# Patient Record
Sex: Male | Born: 1965 | Race: White | Hispanic: No | Marital: Single | Smoking: Never smoker
Health system: Southern US, Community
[De-identification: ages and names within clinical notes are randomized; demographics above are authoritative.]

## PROBLEM LIST (undated history)

## (undated) HISTORY — PX: CHOLECYSTECTOMY: SHX55

---

## 2014-01-27 ENCOUNTER — Emergency Department (HOSPITAL_BASED_OUTPATIENT_CLINIC_OR_DEPARTMENT_OTHER)
Admission: EM | Admit: 2014-01-27 | Discharge: 2014-01-28 | Disposition: A | Discharge status: Home / Self Care | Payer: Worker's Compensation | Attending: Emergency Medicine | Admitting: Emergency Medicine

## 2014-01-27 ENCOUNTER — Encounter (HOSPITAL_BASED_OUTPATIENT_CLINIC_OR_DEPARTMENT_OTHER): Payer: Self-pay | Admitting: Emergency Medicine

## 2014-01-27 ENCOUNTER — Emergency Department (HOSPITAL_BASED_OUTPATIENT_CLINIC_OR_DEPARTMENT_OTHER): Payer: Self-pay | Attending: Emergency Medicine

## 2014-01-27 DIAGNOSIS — Y9389 Activity, other specified: Secondary | ICD-10-CM | POA: Insufficient documentation

## 2014-01-27 DIAGNOSIS — W268XXA Contact with other sharp object(s), not elsewhere classified, initial encounter: Secondary | ICD-10-CM | POA: Insufficient documentation

## 2014-01-27 DIAGNOSIS — Z23 Encounter for immunization: Secondary | ICD-10-CM | POA: Insufficient documentation

## 2014-01-27 DIAGNOSIS — S61214A Laceration without foreign body of right ring finger without damage to nail, initial encounter: Secondary | ICD-10-CM

## 2014-01-27 DIAGNOSIS — Y9289 Other specified places as the place of occurrence of the external cause: Secondary | ICD-10-CM | POA: Insufficient documentation

## 2014-01-27 DIAGNOSIS — Y990 Civilian activity done for income or pay: Secondary | ICD-10-CM | POA: Insufficient documentation

## 2014-01-27 DIAGNOSIS — S61209A Unspecified open wound of unspecified finger without damage to nail, initial encounter: Secondary | ICD-10-CM | POA: Insufficient documentation

## 2014-01-27 NOTE — ED Notes (Signed)
Finger injury at work. Got his ring caught on a piece of metal. Laceration to his right 3rd and 4th digits. Bleeding controlled.

## 2014-01-28 MED ORDER — TETANUS-DIPHTH-ACELL PERTUSSIS 5-2.5-18.5 LF-MCG/0.5 IM SUSP
0.5000 mL | Freq: Once | INTRAMUSCULAR | Status: AC
  Administered 2014-01-28: 0.5 mL via INTRAMUSCULAR
  Filled 2014-01-28: qty 0.5

## 2014-01-28 NOTE — ED Provider Notes (Signed)
CSN: [id number]     Arrival date & time 01/27/14  2139 History   First MD Initiated Contact with Patient 01/28/14 0016     Chief Complaint  Patient presents with  . Finger Injury      Patient is a [age] y.o. male presenting with skin laceration. The history is provided by the patient.  Laceration Location:  Finger Finger laceration location:  R ring finger Length (cm):  2 Quality: jagged   Time since incident: just prior to arrival. Pain details:    Quality:  Aching   Severity:  Moderate   Timing:  Constant   Progression:  Unchanged Relieved by:  None tried Worsened by:  Nothing tried Tetanus status:  Unknown pt got his finger caught on piece of metal at work No crush injury reported Reports pain in right ring finger but no other complaints  PMH - none Past Surgical History  Procedure Laterality Date  . Cholecystectomy     No family history on file. History  Substance Use Topics  . Smoking status: Never Smoker   . Smokeless tobacco: Not on file  . Alcohol Use: Yes    Review of Systems  Skin: Positive for wound.  Neurological: Negative for weakness and numbness.      Allergies  Review of patient's allergies indicates no known allergies.  Home Medications  No current outpatient prescriptions on file. BP 133/81  Pulse 69  Temp(Src) 98.4 F (36.9 C) (Oral)  Resp 20  Ht 5\' 8"  (1.727 m)  Wt 180 lb (81.647 kg)  BMI 27.38 kg/m2  SpO2 97% Physical Exam CONSTITUTIONAL: Well developed/well nourished HEAD: Normocephalic/atraumatic EYES: EOMI ENMT: Mucous membranes moist NECK: supple no meningeal signs CV: S1/S2 noted, no murmurs/rubs/gallops noted LUNGS: Lungs are clear to auscultation bilaterally, no apparent distress ABDOMEN: soft NEURO: Pt is awake/alert, moves all extremitiesx4 EXTREMITIES: pulses normal, full ROM Laceration to lateral aspect of right ring finger, bleeding controlled Pt has full flexion/extension of all fingers on hand Isolated flexion  testing reveals no flexion deficit Isolated extension testing reveals no deficit There is no bone or tendon exposed.  No foreign body noted in wound SKIN: warm, color normal PSYCH: no abnormalities of mood noted  ED Course  NERVE BLOCK Performed by: Joya GaskinsWICKLINE, Alleta Avery W Authorized by: Joya GaskinsWICKLINE, Harmony Sandell W Consent: Verbal consent obtained. Consent given by: patient Indications: pain relief Body area: upper extremity Nerve: digital Laterality: right Patient sedated: no Preparation: Patient was prepped and draped in the usual sterile fashion. Local anesthetic: lidocaine 1% without epinephrine Anesthetic total: 2 ml Outcome: pain improved Patient tolerance: Patient tolerated the procedure well with no immediate complications.   LACERATION REPAIR Performed by: Joya GaskinsWICKLINE,Bhavana Kady W Consent: Verbal consent obtained. Risks and benefits: risks, benefits and alternatives were discussed Patient identity confirmed: provided demographic data Time out performed prior to procedure Prepped and Draped in normal sterile fashion Wound explored Laceration Location: right ring finger Laceration Length: 2cm No Foreign Bodies seen or palpated Anesthesia: nerve block Local anesthetic: lidocaine %  Anesthetic total: 2 ml Irrigation method: syringe Amount of cleaning: standard Skin closure: simple Number of sutures or staples: 3 prolene Technique: simple interrupted Wound debrided during repair Patient tolerance: Patient tolerated the procedure well with no immediate complications.     Imaging Review Dg Hand Complete Right  01/27/2014   CLINICAL DATA:  Laceration fourth finger.  EXAM: RIGHT HAND - COMPLETE 3+ VIEW  COMPARISON:  None.  FINDINGS: No fracture or dislocation.  No radiopaque foreign body.  Suggestion  of soft tissue injury right fourth proximal interphalangeal joint space.  IMPRESSION: No fracture or dislocation.  No radiopaque foreign body.  Suggestion of soft tissue injury right fourth  proximal interphalangeal joint space.   Electronically Signed   By: Bridgett Larsson M.D.   On: 01/27/2014 22:20      MDM   Final diagnoses:  Laceration of right ring finger w/o foreign body w/o damage to nail    Nursing notes including past medical history and social history reviewed and considered in documentation xrays reviewed and considered     Joya Gaskins, MD 01/28/14 [phone]

## 2015-08-20 IMAGING — CR DG HAND COMPLETE 3+V*R*
3 series · 3 of 3 positions shown · non-contrast
Comparison: None.

CLINICAL DATA: Laceration fourth finger.

EXAM:
RIGHT HAND - COMPLETE 3+ VIEW

[x hand pa right]
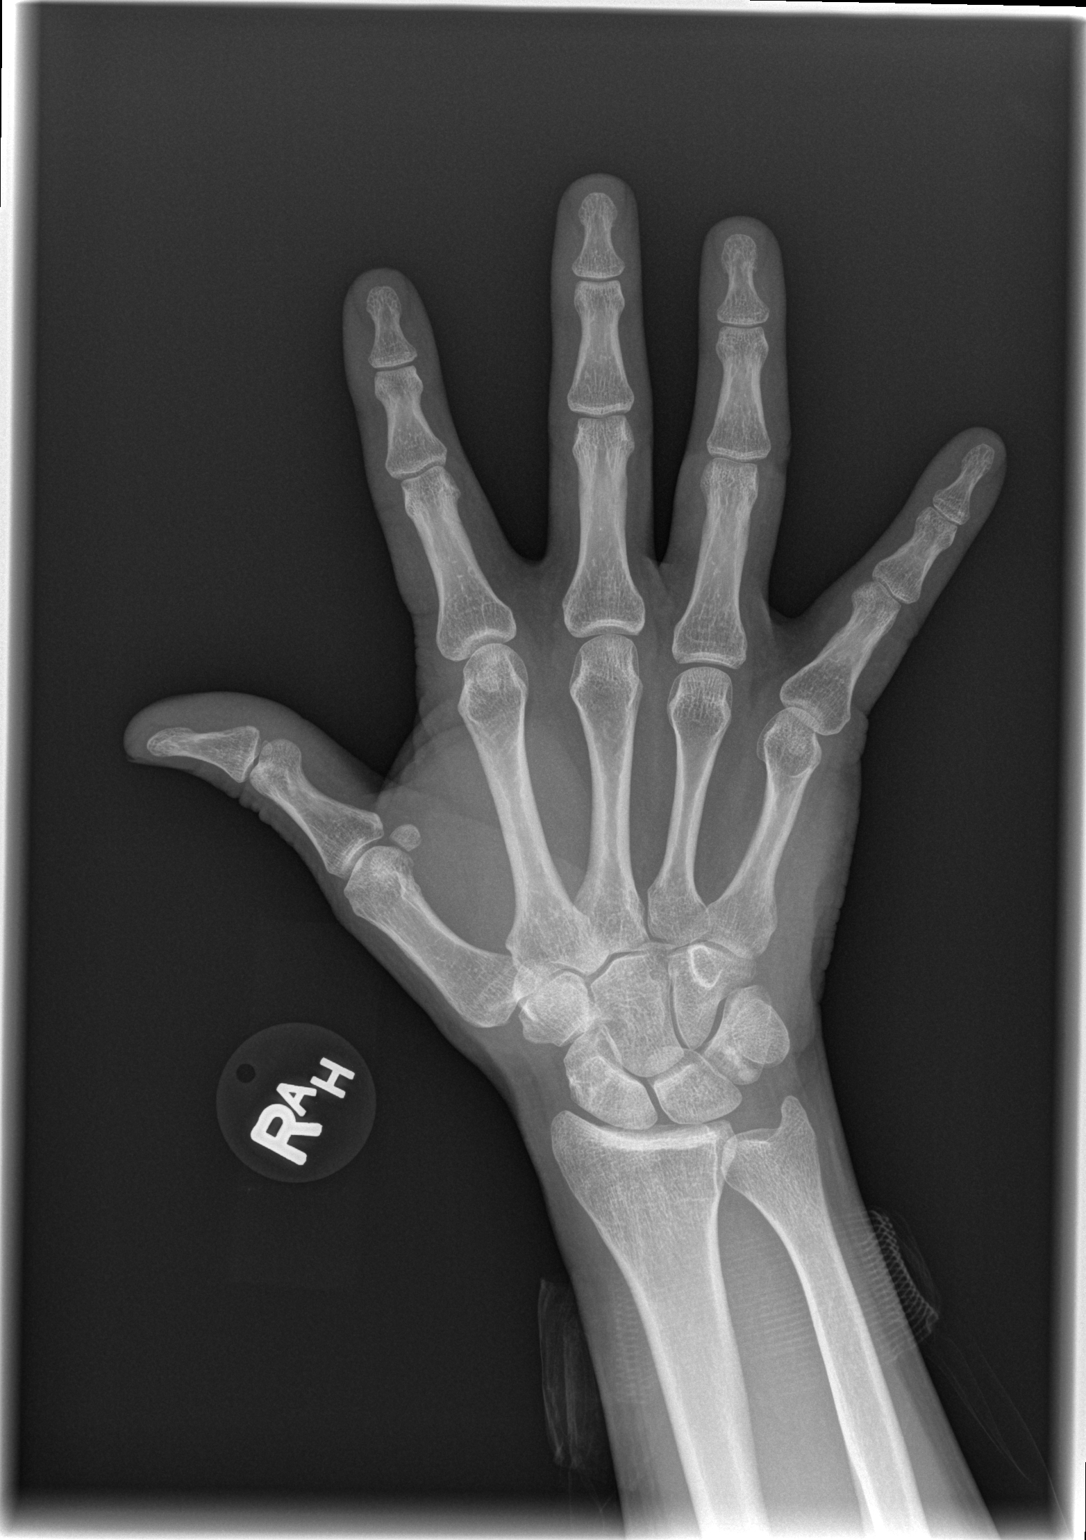

[x hand oblique right]
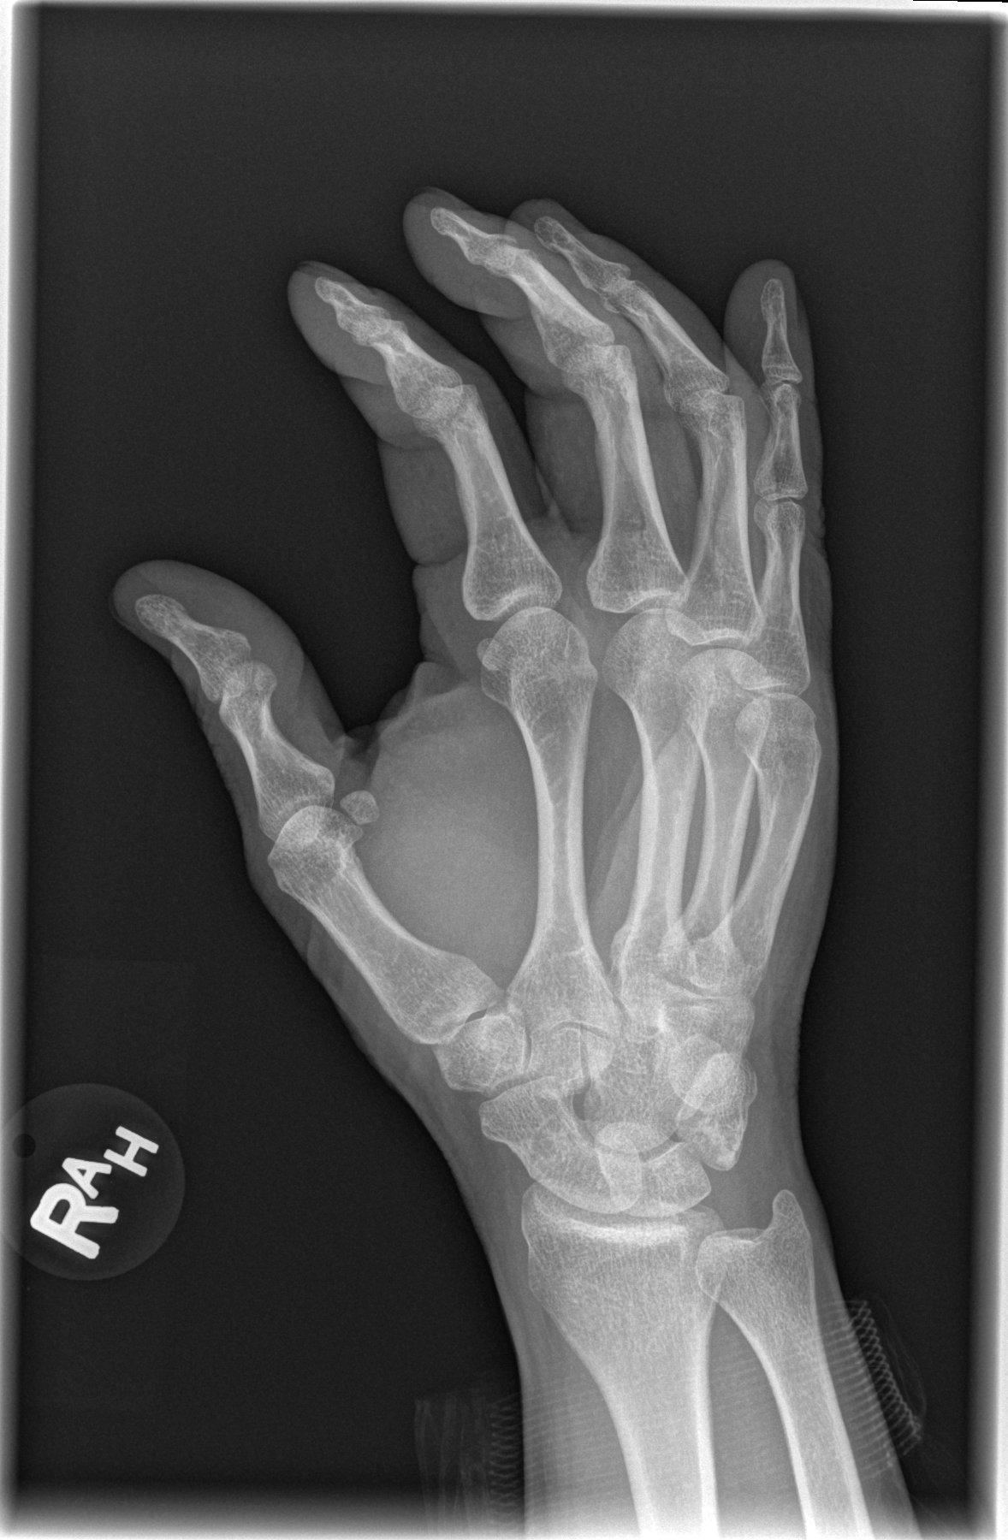

[x hand lat right]
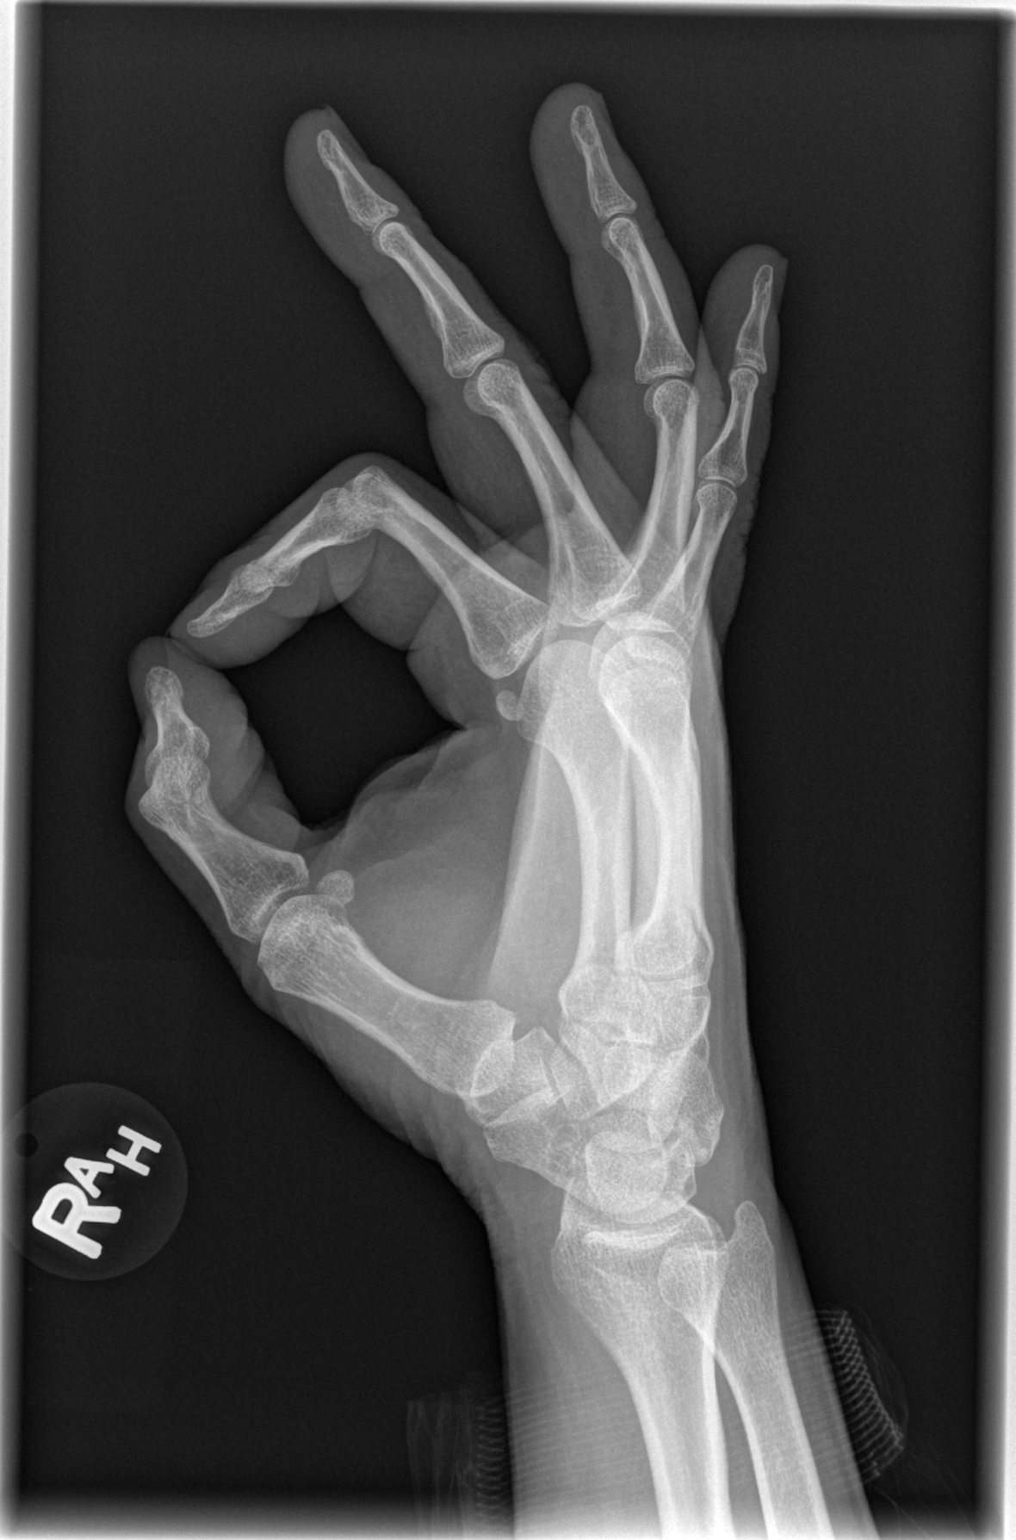

[3 of 3 positions shown; findings below may reference images not displayed]

FINDINGS: No fracture or dislocation.

No radiopaque foreign body.

Suggestion of soft tissue injury right fourth proximal
interphalangeal joint space.
IMPRESSION: No fracture or dislocation.

No radiopaque foreign body.

Suggestion of soft tissue injury right fourth proximal
interphalangeal joint space.
